# Patient Record
Sex: Male | Born: 2000 | Race: White | Hispanic: No | Smoking: Never smoker
Health system: Southern US, Community
[De-identification: ages and names within clinical notes are randomized; demographics above are authoritative.]

## PROBLEM LIST (undated history)

## (undated) DIAGNOSIS — H6690 Otitis media, unspecified, unspecified ear: Secondary | ICD-10-CM

## (undated) HISTORY — DX: Otitis media, unspecified, unspecified ear: H66.90

---
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
## 2022-02-16 ENCOUNTER — Encounter: Payer: Self-pay | Admitting: Pediatrics

## 2022-10-07 ENCOUNTER — Telehealth: Payer: Self-pay | Admitting: Pediatrics

## 2022-10-07 NOTE — Telephone Encounter (Signed)
Mother called back after scheduling Robbie's well check. Mother stated that she had some concerns that she would like Dr.Ram to go over during the visit. Mother is concerned and nervous about his weight, she thinks he is very skinny. Mother stated that he is hyper focused on what he eats and has cut things out of his diet. Mother also requested for Dr.Ram to go over mental health like anxiety and depression.Mother stated that she would like for Dr.Ram to go over STDs and drugs affects. Mother stated that she feels like he may feel over worked because he has been very busy with school. Mother just wants to make sure that he is taking care of himself and she feels like it would be better coming from Dr.Ram than from her all of the time. Mother requested a call from Dr.Ram to have a peace of mind that he is doing okay.   Mother is aware that Dr.Ram is out of the office today and may not get to call her before the appointment in the morning.   Aware that patient is over 18, but per mother request did send message to the provider.

## 2022-10-08 ENCOUNTER — Ambulatory Visit (INDEPENDENT_AMBULATORY_CARE_PROVIDER_SITE_OTHER): Payer: BC Managed Care – PPO | Admitting: Pediatrics

## 2022-10-08 ENCOUNTER — Encounter: Payer: Self-pay | Admitting: Pediatrics

## 2022-10-08 VITALS — BP 116/70 | Ht 77.0 in | Wt 185.3 lb

## 2022-10-08 DIAGNOSIS — Z23 Encounter for immunization: Secondary | ICD-10-CM

## 2022-10-08 DIAGNOSIS — Z00129 Encounter for routine child health examination without abnormal findings: Secondary | ICD-10-CM

## 2022-10-08 DIAGNOSIS — Z1339 Encounter for screening examination for other mental health and behavioral disorders: Secondary | ICD-10-CM

## 2022-10-08 DIAGNOSIS — Z0000 Encounter for general adult medical examination without abnormal findings: Secondary | ICD-10-CM

## 2022-10-08 DIAGNOSIS — Z68.41 Body mass index (BMI) pediatric, 5th percentile to less than 85th percentile for age: Secondary | ICD-10-CM

## 2022-10-08 LAB — CBC WITH DIFFERENTIAL/PLATELET
Absolute Monocytes: 532 cells/uL (ref 200–950)
Hemoglobin: 16.4 g/dL (ref 13.2–17.1)
MCV: 86.3 fL (ref 80.0–100.0)
Neutrophils Relative %: 72.3 %
RBC: 5.42 10*6/uL (ref 4.20–5.80)
RDW: 12.7 % (ref 11.0–15.0)
WBC: 7.6 10*3/uL (ref 3.8–10.8)

## 2022-10-08 NOTE — Progress Notes (Signed)
[phone]  Exercise --lifts weights  [phone]  Subjective:    Robert Chase is a 22 y.o. male who presents for Medicare Annual/Subsequent preventive examination.   Preventive Screening-Counseling & Management  Tobacco Social History   Tobacco Use  Smoking Status Never  Smokeless Tobacco Never    Current Problems (verified) Patient Active Problem List   Diagnosis Date Noted   BMI (body mass index), pediatric, 5% to less than 85% for age 68/24/2016   Well child check 03/30/2013    Medications Prior to Visit None   Current Medications (verified) No current outpatient medications on file.   No current facility-administered medications for this visit.     Allergies (verified) Patient has no known allergies.   PAST HISTORY   Social History Social History   Tobacco Use   Smoking status: Never   Smokeless tobacco: Never  Substance Use Topics   Alcohol use: No    Alcohol/week: 0.0 standard drinks of alcohol    Are there smokers in your home (other than you)?  No  Risk Factors Current exercise habits: Home exercise routine includes stretching.  Dietary issues discussed: yes   Cardiac risk factors: none.  Depression Screen (Note: if answer to either of the following is "Yes", a more complete depression screening is indicated)   Q1: Over the past two weeks, have you felt down, depressed or hopeless? No  Q2: Over the past two weeks, have you felt little interest or pleasure in doing things? No  Have you lost interest or pleasure in daily life? No  Do you often feel hopeless? No  Do you cry easily over simple problems? No   Immunization History  Administered Date(s) Administered   DTaP 12/20/2002, 02/20/2003, 05/08/2003, 01/22/2004, 01/11/2008   HIB (PRP-OMP) 12/20/2002, 02/20/2003, 05/08/2003, 01/22/2004   HPV 9-valent 04/01/2016, 10/19/2016   Hepatitis A 11/18/2009, 05/26/2012   Hepatitis B 15-Aug-2002, 12/20/2002, 08/08/2003   IPV 12/20/2002,  02/20/2003, 08/08/2003, 01/11/2008   Influenza Nasal 04/12/2008, 03/27/2009, 03/18/2011, 05/26/2012   Influenza,Quad,Nasal, Live 03/30/2013   Influenza,inj,Quad PF,6+ Mos 04/01/2016, 04/13/2017, 03/27/2019, 04/29/2020   MMR 10/23/2003, 01/11/2008   Meningococcal B, OMV 04/29/2020, 08/15/2021   Meningococcal Conjugate 09/27/2014, 03/27/2019   PFIZER(Purple Top)SARS-COV-2 Vaccination 06/20/2020   Pfizer Covid-19 Vaccine Bivalent Booster 38yrs & up 06/24/2021   Pneumococcal Conjugate-13 12/20/2002, 05/08/2003, 01/22/2004, 02/23/2008   Tdap 09/27/2014, 10/08/2022   Varicella 10/23/2003, 01/11/2008    Screening Tests Health Maintenance  Topic Date Due   COVID-19 Vaccine (3 - 2023-24 season) 10/24/2022 (Originally 03/06/2022)   Hepatitis C Screening  10/08/2023 (Originally 10/19/2020)   HIV Screening  10/08/2023 (Originally 10/19/2017)   INFLUENZA VACCINE  02/04/2023   DTaP/Tdap/Td (8 - Td or Tdap) 10/07/2032   HPV VACCINES  Completed    All answers were reviewed with the patient and necessary referrals were made:  Marcha Solders, MD   10/08/2022   History reviewed: allergies, current medications, past family history, past medical history, past social history, past surgical history, and problem list  Review of Systems Pertinent items are noted in HPI.    Objective:     Vision by Snellen chart: right eye:20/20, left eye:20/20 Blood pressure 116/70, height 6\' 5"  (1.956 m), weight 185 lb 4.8 oz (84.1 kg). Body mass index is 21.97 kg/m.  BP 116/70   Ht 6\' 5"  (1.956 m)   Wt 185 lb 4.8 oz (84.1 kg)   BMI 21.97 kg/m  General appearance: alert, cooperative, and no distress Head: Normocephalic, without obvious abnormality Eyes: negative Ears:  normal TM's and external ear canals both ears Nose: Nares normal. Septum midline. Mucosa normal. No drainage or sinus tenderness. Throat: lips, mucosa, and tongue normal; teeth and gums normal Neck: no adenopathy and supple, symmetrical,  trachea midline Back: no kyphosis present, no scoliosis present, symmetric, no curvature. ROM normal. No CVA tenderness. Lungs: clear to auscultation bilaterally Heart: regular rate and rhythm, S1, S2 normal, no murmur, click, rub or gallop Abdomen: soft, non-tender; bowel sounds normal; no masses,  no organomegaly Male genitalia: normal Extremities: extremities normal, atraumatic, no cyanosis or edema Pulses: 2+ and symmetric Skin: Skin color, texture, turgor normal. No rashes or lesions Neurologic: Alert and oriented X 3, normal strength and tone. Normal symmetric reflexes. Normal coordination and gait     Assessment:     Well male      Plan:     During the course of the visit the patient was educated and counseled about appropriate screening and preventive services including:   Td vaccine Nutrition counseling   Orders Placed This Encounter  Procedures   Tdap vaccine greater than or equal to 7yo IM   T4, free   TSH   Lipid panel   CBC with Differential/Platelet   Comprehensive metabolic panel      Patient Instructions (the written plan) was given to the patient.  Medicare Attestation I have personally reviewed: The patient's medical and social history Their use of alcohol, tobacco or illicit drugs Their current medications and supplements The patient's functional ability including ADLs,fall risks, home safety risks, cognitive, and hearing and visual impairment Diet and physical activities Evidence for depression or mood disorders  The patient's weight, height, BMI, and visual acuity have been recorded in the chart.  I have made referrals, counseling, and provided education to the patient based on review of the above and I have provided the patient with a written personalized care plan for preventive services.     Marcha Solders, MD   10/08/2022

## 2022-10-08 NOTE — Patient Instructions (Signed)
Preventive Care 18-21 Years Old, Male ?Preventive care refers to lifestyle choices and visits with your health care provider that can promote health and wellness. At this stage in your life, you may start seeing a primary care physician instead of a pediatrician for your preventive care. Preventive care visits are also called wellness exams. ?What can I expect for my preventive care visit? ?Counseling ?During your preventive care visit, your health care provider may ask about your: ?Medical history, including: ?Past medical problems. ?Family medical history. ?Current health, including: ?Home life and relationship well-being. ?Emotional well-being. ?Sexual activity and sexual health. ?Lifestyle, including: ?Alcohol, nicotine or tobacco, and drug use. ?Access to firearms. ?Diet, exercise, and sleep habits. ?Sunscreen use. ?Motor vehicle safety. ?Physical exam ?Your health care provider may check your: ?Height and weight. These may be used to calculate your BMI (body mass index). BMI is a measurement that tells if you are at a healthy weight. ?Waist circumference. This measures the distance around your waistline. This measurement also tells if you are at a healthy weight and may help predict your risk of certain diseases, such as type 2 diabetes and high blood pressure. ?Heart rate and blood pressure. ?Body temperature. ?Skin for abnormal spots. ?What immunizations do I need? ? ?Vaccines are usually given at various ages, according to a schedule. Your health care provider will recommend vaccines for you based on your age, medical history, and lifestyle or other factors, such as travel or where you work. ?What tests do I need? ?Screening ?Your health care provider may recommend screening tests for certain conditions. This may include: ?Vision and hearing tests. ?Lipid and cholesterol levels. ?Hepatitis B test. ?Hepatitis C test. ?HIV (human immunodeficiency virus) test. ?STI (sexually transmitted infection) testing, if  you are at risk. ?Tuberculosis skin test. ?Talk with your health care provider about your test results, treatment options, and if necessary, the need for more tests. ?Follow these instructions at home: ?Eating and drinking ? ?Eat a healthy diet that includes fresh fruits and vegetables, whole grains, lean protein, and low-fat dairy products. ?Drink enough fluid to keep your urine pale yellow. ?Do not drink alcohol if: ?Your health care provider tells you not to drink. ?You are under the legal drinking age. In the [place] the legal drinking age is 21. ?If you drink alcohol: ?Limit how much you have to 0-2 drinks a day. ?Know how much alcohol is in your drink. In the [place] one drink equals one 12 oz bottle of beer (355 mL), one 5 oz glass of wine (148 mL), or one 1? oz glass of hard liquor (44 mL). ?Lifestyle ?Brush your teeth every morning and night with fluoride toothpaste. Floss one time each day. ?Exercise for at least 30 minutes 5 or more days of the week. ?Do not use any products that contain nicotine or tobacco. These products include cigarettes, chewing tobacco, and vaping devices, such as e-cigarettes. If you need help quitting, ask your health care provider. ?Do not use drugs. ?If you are sexually active, practice safe sex. Use a condom or other form of protection to prevent STIs. ?Find healthy ways to manage stress, such as: ?Meditation, yoga, or listening to music. ?Journaling. ?Talking to a trusted person. ?Spending time with friends and family. ?Safety ?Always wear your seat belt while driving or riding in a vehicle. ?Do not drive: ?If you have been drinking alcohol. Do not ride with someone who has been drinking. ?When you are tired or distracted. ?While texting. ?If you have been using   any mind-altering substances or drugs. ?Wear a helmet and other protective equipment during sports activities. ?If you have firearms in your house, make sure you follow all gun safety procedures. ?Seek help if you have  been bullied, physically abused, or sexually abused. ?Use the internet responsibly to avoid dangers, such as online bullying and online sex predators. ?What's next? ?Go to your health care provider once a year for an annual wellness visit. ?Ask your health care provider how often you should have your eyes and teeth checked. ?Stay up to date on all vaccines. ?This information is not intended to replace advice given to you by your health care provider. Make sure you discuss any questions you have with your health care provider. ?Document Revised: 12/18/2020 Document Reviewed: 12/18/2020 ?[employer] Patient Education ? 2023 [employer] ? ?

## 2022-10-09 LAB — TSH: TSH: 1.67 mIU/L (ref 0.50–4.30)

## 2022-10-09 LAB — COMPREHENSIVE METABOLIC PANEL
AG Ratio: 2.7 (calc) — ABNORMAL HIGH (ref 1.0–2.5)
ALT: 44 U/L (ref 8–46)
AST: 27 U/L (ref 12–32)
Albumin: 5.2 g/dL — ABNORMAL HIGH (ref 3.6–5.1)
Alkaline phosphatase (APISO): 55 U/L (ref 46–169)
BUN: 12 mg/dL (ref 7–20)
CO2: 29 mmol/L (ref 20–32)
Calcium: 9.6 mg/dL (ref 8.9–10.4)
Chloride: 104 mmol/L (ref 98–110)
Creat: 0.81 mg/dL (ref 0.60–1.24)
Globulin: 1.9 g/dL (calc) — ABNORMAL LOW (ref 2.1–3.5)
Glucose, Bld: 85 mg/dL (ref 65–99)
Potassium: 4 mmol/L (ref 3.8–5.1)
Sodium: 143 mmol/L (ref 135–146)
Total Bilirubin: 0.7 mg/dL (ref 0.2–1.1)
Total Protein: 7.1 g/dL (ref 6.3–8.2)

## 2022-10-09 LAB — CBC WITH DIFFERENTIAL/PLATELET
Basophils Absolute: 23 cells/uL (ref 0–200)
Basophils Relative: 0.3 %
Eosinophils Absolute: 68 cells/uL (ref 15–500)
Eosinophils Relative: 0.9 %
HCT: 46.8 % (ref 38.5–50.0)
Lymphs Abs: 1482 cells/uL (ref 850–3900)
MCH: 30.3 pg (ref 27.0–33.0)
MCHC: 35 g/dL (ref 32.0–36.0)
MPV: 10.7 fL (ref 7.5–12.5)
Monocytes Relative: 7 %
Neutro Abs: 5495 cells/uL (ref 1500–7800)
Platelets: 240 10*3/uL (ref 140–400)
Total Lymphocyte: 19.5 %

## 2022-10-09 LAB — LIPID PANEL
Cholesterol: 133 mg/dL (ref <170)
HDL: 59 mg/dL (ref >45)
LDL Cholesterol (Calc): 60 mg/dL (calc) (ref <110)
Non-HDL Cholesterol (Calc): 74 mg/dL (calc) (ref <120)
Total CHOL/HDL Ratio: 2.3 (calc) (ref <5.0)
Triglycerides: 64 mg/dL (ref <90)

## 2022-10-09 LAB — T4, FREE: Free T4: 1.3 ng/dL (ref 0.8–1.4)

## 2022-10-11 NOTE — Telephone Encounter (Signed)
Discussed mom's concerns and reassured her

## 2022-10-13 ENCOUNTER — Ambulatory Visit: Payer: BC Managed Care – PPO | Admitting: Pediatrics

## 2023-03-16 ENCOUNTER — Encounter: Payer: Self-pay | Admitting: Pediatrics

## 2023-11-22 DIAGNOSIS — Z0000 Encounter for general adult medical examination without abnormal findings: Secondary | ICD-10-CM | POA: Diagnosis not present

## 2023-12-17 DIAGNOSIS — H6992 Unspecified Eustachian tube disorder, left ear: Secondary | ICD-10-CM | POA: Diagnosis not present

## 2024-06-23 ENCOUNTER — Other Ambulatory Visit (HOSPITAL_COMMUNITY): Payer: Self-pay
